# Patient Record
Sex: Male | Born: 1960 | Race: White | Hispanic: No | Marital: Married | Smoking: Light tobacco smoker
Health system: Southern US, Community
[De-identification: ages and names within clinical notes are randomized; demographics above are authoritative.]

## PROBLEM LIST (undated history)

## (undated) DIAGNOSIS — I499 Cardiac arrhythmia, unspecified: Secondary | ICD-10-CM

## (undated) DIAGNOSIS — R002 Palpitations: Secondary | ICD-10-CM

## (undated) DIAGNOSIS — E559 Vitamin D deficiency, unspecified: Secondary | ICD-10-CM

## (undated) HISTORY — DX: Vitamin D deficiency, unspecified: E55.9

## (undated) HISTORY — DX: Palpitations: R00.2

## (undated) HISTORY — DX: Cardiac arrhythmia, unspecified: I49.9

---

## 2006-10-09 ENCOUNTER — Encounter: Admission: RE | Admit: 2006-10-09 | Discharge: 2007-01-07 | Payer: Self-pay | Admitting: Family Medicine

## 2013-03-23 ENCOUNTER — Ambulatory Visit: Payer: Self-pay | Admitting: Family Medicine

## 2013-07-21 ENCOUNTER — Ambulatory Visit (INDEPENDENT_AMBULATORY_CARE_PROVIDER_SITE_OTHER): Payer: BC Managed Care – PPO | Admitting: Family Medicine

## 2013-07-21 ENCOUNTER — Encounter: Payer: Self-pay | Admitting: Family Medicine

## 2013-07-21 VITALS — BP 115/64 | HR 65 | Temp 97.2°F | Ht 70.0 in | Wt 170.4 lb

## 2013-07-21 DIAGNOSIS — R079 Chest pain, unspecified: Secondary | ICD-10-CM

## 2013-07-21 NOTE — Progress Notes (Signed)
  Subjective:    Patient ID: KOAH CHISENHALL, male    DOB: 08/25/1961, 52 y.o.   MRN: [id number]  HPI This 52 y.o. male presents for evaluation of elevated blood pressure.  He was having some Chest pressure this am and he had some anxiety.  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   Review of Systems C/o chest pain   No SOB, HA, dizziness, vision change, N/V, diarrhea, constipation, dysuria, urinary urgency or frequency, myalgias, arthralgias or rash.  Objective:   Physical Exam Vital signs noted  Well developed well nourished male.  HEENT - Head atraumatic Normocephalic                Eyes - PERRLA, Conjuctiva - clear Sclera- Clear EOMI                Ears - EAC's Wnl TM's Wnl Gross Hearing WNL                Nose - Nares patent                 Throat - oropharanx wnl Respiratory - Lungs CTA bilateral Cardiac - RRR S1 and S2 without murmur GI - Abdomen soft Nontender and bowel sounds active x 4 Extremities - No edema. Neuro - Grossly intact.   EKG - NSR without acute ST-T changes.    Assessment & Plan:  Chest pain - Plan: EKG 12-Lead, Ambulatory referral to Cardiology He is not currently having any chest pain and think that there could  Be possibly anxiety component but with his strong family hx would Be good idea to get stress test and work up.  Start ASA 325mg  po qd.

## 2013-07-21 NOTE — Patient Instructions (Signed)
Chest Pain (Nonspecific) °It is often hard to give a specific diagnosis for the cause of chest pain. There is always a chance that your pain could be related to something serious, such as a heart attack or a blood clot in the lungs. You need to follow up with your caregiver for further evaluation. °CAUSES  °· Heartburn. °· Pneumonia or bronchitis. °· Anxiety or stress. °· Inflammation around your heart (pericarditis) or lung (pleuritis or pleurisy). °· A blood clot in the lung. °· A collapsed lung (pneumothorax). It can develop suddenly on its own (spontaneous pneumothorax) or from injury (trauma) to the chest. °· Shingles infection (herpes zoster virus). °The chest wall is composed of bones, muscles, and cartilage. Any of these can be the source of the pain. °· The bones can be bruised by injury. °· The muscles or cartilage can be strained by coughing or overwork. °· The cartilage can be affected by inflammation and become sore (costochondritis). °DIAGNOSIS  °Lab tests or other studies, such as X-rays, electrocardiography, stress testing, or cardiac imaging, may be needed to find the cause of your pain.  °TREATMENT  °· Treatment depends on what may be causing your chest pain. Treatment may include: °· Acid blockers for heartburn. °· Anti-inflammatory medicine. °· Pain medicine for inflammatory conditions. °· Antibiotics if an infection is present. °· You may be advised to change lifestyle habits. This includes stopping smoking and avoiding alcohol, caffeine, and chocolate. °· You may be advised to keep your head raised (elevated) when sleeping. This reduces the chance of acid going backward from your stomach into your esophagus. °· Most of the time, nonspecific chest pain will improve within 2 to 3 days with rest and mild pain medicine. °HOME CARE INSTRUCTIONS  °· If antibiotics were prescribed, take your antibiotics as directed. Finish them even if you start to feel better. °· For the next few days, avoid physical  activities that bring on chest pain. Continue physical activities as directed. °· Do not smoke. °· Avoid drinking alcohol. °· Only take over-the-counter or prescription medicine for pain, discomfort, or fever as directed by your caregiver. °· Follow your caregiver's suggestions for further testing if your chest pain does not go away. °· Keep any follow-up appointments you made. If you do not go to an appointment, you could develop lasting (chronic) problems with pain. If there is any problem keeping an appointment, you must call to reschedule. °SEEK MEDICAL CARE IF:  °· You think you are having problems from the medicine you are taking. Read your medicine instructions carefully. °· Your chest pain does not go away, even after treatment. °· You develop a rash with blisters on your chest. °SEEK IMMEDIATE MEDICAL CARE IF:  °· You have increased chest pain or pain that spreads to your arm, neck, jaw, back, or abdomen. °· You develop shortness of breath, an increasing cough, or you are coughing up blood. °· You have severe back or abdominal pain, feel nauseous, or vomit. °· You develop severe weakness, fainting, or chills. °· You have a fever. °THIS IS AN EMERGENCY. Do not wait to see if the pain will go away. Get medical help at once. Call your local emergency services (911 in [place] Do not drive yourself to the hospital. °MAKE SURE YOU:  °· Understand these instructions. °· Will watch your condition. °· Will get help right away if you are not doing well or get worse. °Document Released: 08/08/2005 Document Revised: 01/21/2012 Document Reviewed: 06/03/2008 °Exit[employer]® Patient Information ©2014 [employer]   [employer]. ° °

## 2013-08-06 ENCOUNTER — Ambulatory Visit (INDEPENDENT_AMBULATORY_CARE_PROVIDER_SITE_OTHER): Payer: BC Managed Care – PPO | Admitting: Family Medicine

## 2013-08-06 ENCOUNTER — Other Ambulatory Visit: Payer: Self-pay | Admitting: *Deleted

## 2013-08-06 VITALS — BP 123/84 | HR 71 | Ht 70.0 in | Wt 170.0 lb

## 2013-08-06 DIAGNOSIS — R002 Palpitations: Secondary | ICD-10-CM

## 2013-08-06 DIAGNOSIS — R0789 Other chest pain: Secondary | ICD-10-CM

## 2013-08-06 DIAGNOSIS — Z8679 Personal history of other diseases of the circulatory system: Secondary | ICD-10-CM

## 2013-08-06 DIAGNOSIS — Z860100 Personal history of colon polyps, unspecified: Secondary | ICD-10-CM | POA: Insufficient documentation

## 2013-08-06 DIAGNOSIS — K3189 Other diseases of stomach and duodenum: Secondary | ICD-10-CM

## 2013-08-06 DIAGNOSIS — K30 Functional dyspepsia: Secondary | ICD-10-CM

## 2013-08-06 DIAGNOSIS — E559 Vitamin D deficiency, unspecified: Secondary | ICD-10-CM | POA: Insufficient documentation

## 2013-08-06 DIAGNOSIS — E785 Hyperlipidemia, unspecified: Secondary | ICD-10-CM | POA: Insufficient documentation

## 2013-08-06 NOTE — Progress Notes (Signed)
  Subjective:    Patient ID: Robert Thomas, male    DOB: Apr 02, 1961, 52 y.o.   MRN: [id number]  HPI Patient comes to clinic today because of continued heaviness and fullness in his chest. He does drink caffeine and this may be somewhat associated with his caffeine intake. He does smoke and has been smoking since he was about 52 years of age, about a pack a day. He has a positive family history for heart disease and that his dad had a stroke in his 90s and died of a heart attack when he was 6. This patient has a history of hyperlipidemia. He does say that he can do work outside and not had any chest discomfort. At other times not necessarily activity related he can have chest pressure tightness or palpitations. This seems to be associated with caffeine intake also. He does have some indigestion at times.   Review of Systems  Constitutional: Positive for diaphoresis and fatigue.  Cardiovascular: Positive for chest pain and palpitations.       Objective:   Physical Exam  Nursing note and vitals reviewed. Constitutional: He is oriented to person, place, and time. He appears well-developed and well-nourished. No distress.  HENT:  Head: Normocephalic.  Eyes: Right eye exhibits no discharge. Left eye exhibits no discharge. No scleral icterus.  Neck: Normal range of motion. Neck supple. No thyromegaly present.  Cardiovascular: Normal rate, regular rhythm, normal heart sounds and intact distal pulses.  Exam reveals no gallop and no friction rub.   No murmur heard. At 72 per minute  Pulmonary/Chest: Effort normal and breath sounds normal. He has no wheezes. He has no rales.  Abdominal: Soft. Bowel sounds are normal. He exhibits no mass. There is no tenderness. There is no rebound and no guarding.  Musculoskeletal: Normal range of motion. He exhibits no edema and no tenderness.  Neurological: He is alert and oriented to person, place, and time.  Skin: Skin is warm and dry. No rash noted. He is not  diaphoretic.  Psychiatric: He has a normal mood and affect. His behavior is normal. Judgment and thought content normal.  Quietly anxious about his condition   . EKG today within normal limits      Assessment & Plan:   1. Palpitations   2. History of cardiac arrhythmia   3. Vitamin D deficiency   4. Hyperlipidemia   5. Personal history of colonic polyps   6. Chest pressure   7. Acid indigestion    We will place Robert Thomas of Hearts monitor on patient today  Patient Instructions  Keep appointment with cardiologist as planned Return to clinic for lab work fasting Completely avoid caffeine Try to make a special effort to completely stop smoking Take Zantac 150 over-the-counter twice daily at least until you see the cardiologist We will do 18 of hearts monitor on use to help him when he evaluates you Do not forget to get your flu shot Continue aggressive therapeutic lifestyle changes which include diet and exercise   Nyra Capes MD  .

## 2013-08-06 NOTE — Addendum Note (Signed)
Addended by: Ardine Eng A on: 08/06/2013 03:23 PM   Modules accepted: Orders

## 2013-08-06 NOTE — Addendum Note (Signed)
Addended by: Bernita Buffy on: 08/06/2013 01:23 PM   Modules accepted: Orders

## 2013-08-06 NOTE — Progress Notes (Signed)
KOH event monitor hook up complete on patient. Pt voices understanding of use and baseline transmission complete. DX palpitations and chest pain

## 2013-08-06 NOTE — Patient Instructions (Signed)
Keep appointment with cardiologist as planned Return to clinic for lab work fasting Completely avoid caffeine Try to make a special effort to completely stop smoking Take Zantac 150 over-the-counter twice daily at least until you see the cardiologist We will do 18 of hearts monitor on use to help him when he evaluates you Do not forget to get your flu shot Continue aggressive therapeutic lifestyle changes which include diet and exercise

## 2013-08-07 ENCOUNTER — Other Ambulatory Visit (INDEPENDENT_AMBULATORY_CARE_PROVIDER_SITE_OTHER): Payer: BC Managed Care – PPO

## 2013-08-07 DIAGNOSIS — R002 Palpitations: Secondary | ICD-10-CM

## 2013-08-07 DIAGNOSIS — E559 Vitamin D deficiency, unspecified: Secondary | ICD-10-CM

## 2013-08-07 DIAGNOSIS — E785 Hyperlipidemia, unspecified: Secondary | ICD-10-CM

## 2013-08-07 NOTE — Progress Notes (Unsigned)
Pt came in for labs only 

## 2013-08-09 LAB — BMP8+EGFR
BUN: 11 mg/dL (ref 6–24)
CO2: 27 mmol/L (ref 18–29)
Calcium: 9 mg/dL (ref 8.7–10.2)
Chloride: 103 mmol/L (ref 97–108)
Glucose: 102 mg/dL — ABNORMAL HIGH (ref 65–99)
Potassium: 4.3 mmol/L (ref 3.5–5.2)

## 2013-08-09 LAB — NMR, LIPOPROFILE
Cholesterol: 143 mg/dL (ref <200)
LDL Particle Number: 1483 nmol/L — ABNORMAL HIGH (ref <1000)
LDL Size: 20.4 nm — ABNORMAL LOW (ref >20.5)
LP-IR Score: 46 — ABNORMAL HIGH (ref <=45)

## 2013-08-09 LAB — HEPATIC FUNCTION PANEL
ALT: 9 IU/L (ref 0–44)
Albumin: 4.4 g/dL (ref 3.5–5.5)
Total Bilirubin: 0.4 mg/dL (ref 0.0–1.2)
Total Protein: 6.3 g/dL (ref 6.0–8.5)

## 2013-08-10 ENCOUNTER — Telehealth: Payer: Self-pay | Admitting: *Deleted

## 2013-08-10 NOTE — Telephone Encounter (Signed)
Left message on patient's cell phone that transmissions so far are normal.

## 2013-08-20 ENCOUNTER — Ambulatory Visit (INDEPENDENT_AMBULATORY_CARE_PROVIDER_SITE_OTHER): Payer: BC Managed Care – PPO | Admitting: Cardiovascular Disease

## 2013-08-20 ENCOUNTER — Encounter: Payer: Self-pay | Admitting: Cardiovascular Disease

## 2013-08-20 VITALS — BP 125/85 | HR 70 | Ht 70.0 in | Wt 169.0 lb

## 2013-08-20 DIAGNOSIS — R079 Chest pain, unspecified: Secondary | ICD-10-CM | POA: Insufficient documentation

## 2013-08-20 DIAGNOSIS — R002 Palpitations: Secondary | ICD-10-CM

## 2013-08-20 DIAGNOSIS — IMO0001 Reserved for inherently not codable concepts without codable children: Secondary | ICD-10-CM

## 2013-08-20 DIAGNOSIS — F17200 Nicotine dependence, unspecified, uncomplicated: Secondary | ICD-10-CM

## 2013-08-20 NOTE — Assessment & Plan Note (Signed)
Counseled for less than 10 minutes  Connection to his congestion and sinus issues discussed.  Quit while in [employer]  Down to 1/2 ppd Thinks he can do it cold [place]

## 2013-08-20 NOTE — Patient Instructions (Addendum)

## 2013-08-20 NOTE — Assessment & Plan Note (Signed)
Cholesterol is at goal.  Continue current dose of statin and diet Rx.  No myalgias or side effects.  F/U  LFT's in 6 months. No results found for this basename: [facility]   Labs with Dr Buel Ream office

## 2013-08-20 NOTE — Assessment & Plan Note (Signed)
Benign Did review the only 3 transmissions from his monitor and they relfected NSR with no arrhythmia

## 2013-08-20 NOTE — Progress Notes (Signed)
Patient ID: Robert Thomas, male   DOB: 10/15/1961, 52 y.o.   MRN: [id number] 52 yo referred by Dr Christell Constant for palpitations and chest pain.  Describes a heaviness/fullness in chest . He does drink caffeine and this may be somewhat associated with his caffeine intake. He does smoke and has been smoking since he was about 52 years of age, about a pack a day. He has a positive family history for heart disease and that his dad had a stroke in his 88s and died of a heart attack when he was 47. This patient has a history of hyperlipidemia. He does say that he can do work outside and not had any chest discomfort. At other times not necessarily activity related he can have chest pressure tightness or palpitations. This seems to be associated with caffeine intake also. He does have some indigestion at times.  Also seen in ER 9/9 with chest pain Atypical ? Anxiety  D/c for outpatient f/u   Indicates similar symptoms in 1998 and thinks he passed a stress test  ROS: Denies fever, malais, weight loss, blurry vision, decreased visual acuity, cough, sputum, SOB, hemoptysis, pleuritic pain, palpitaitons, heartburn, abdominal pain, melena, lower extremity edema, claudication, or rash.  All other systems reviewed and negative   General: Affect appropriate Healthy:  appears stated age HEENT: normal Neck supple with no adenopathy JVP normal no bruits no thyromegaly Lungs clear with no wheezing and good diaphragmatic motion Heart:  S1/S2 no murmur,rub, gallop or click PMI normal Abdomen: benighn, BS positve, no tenderness, no AAA no bruit.  No HSM or HJR Distal pulses intact with no bruits No edema Neuro non-focal Skin warm and dry No muscular weakness  Medications Current Outpatient Prescriptions  Medication Sig Dispense Refill  . aspirin 81 MG tablet Take 81 mg by mouth daily.       No current facility-administered medications for this visit.    Allergies Review of patient's allergies indicates no known  allergies.  Family History: No family history on file.  Social History: History   Social History  . Marital Status: Married    Spouse Name: N/A    Number of Children: N/A  . Years of Education: N/A   Occupational History  . Not on file.   Social History Main Topics  . Smoking status: Light Tobacco Smoker -- 1.00 packs/day    Types: Cigarettes    Start date: 07/21/1976  . Smokeless tobacco: Not on file     Comment: 'almost done with smoking'  . Alcohol Use: No  . Drug Use: No  . Sexual Activity: Not on file   Other Topics Concern  . Not on file   Social History Narrative  . No narrative on file    Electrocardiogram:  9/25  SR rate 95 normal ECG   Assessment and Plan

## 2013-08-20 NOTE — Assessment & Plan Note (Signed)
Typical and atypical features  Normal ECG  F/U ETT

## 2013-09-24 ENCOUNTER — Encounter: Payer: BC Managed Care – PPO | Admitting: Physician Assistant

## 2015-05-30 ENCOUNTER — Ambulatory Visit (INDEPENDENT_AMBULATORY_CARE_PROVIDER_SITE_OTHER): Payer: 59 | Admitting: Family Medicine

## 2015-05-30 ENCOUNTER — Encounter (INDEPENDENT_AMBULATORY_CARE_PROVIDER_SITE_OTHER): Payer: Self-pay

## 2015-05-30 ENCOUNTER — Encounter: Payer: Self-pay | Admitting: Family Medicine

## 2015-05-30 VITALS — BP 130/86 | HR 79 | Temp 97.2°F | Ht 69.0 in | Wt 173.0 lb

## 2015-05-30 DIAGNOSIS — Z111 Encounter for screening for respiratory tuberculosis: Secondary | ICD-10-CM | POA: Diagnosis not present

## 2015-05-30 DIAGNOSIS — Z0000 Encounter for general adult medical examination without abnormal findings: Secondary | ICD-10-CM

## 2015-05-30 LAB — POCT URINALYSIS DIPSTICK
Bilirubin, UA: NEGATIVE
Glucose, UA: NEGATIVE
Ketones, UA: NEGATIVE
Leukocytes, UA: NEGATIVE
Nitrite, UA: NEGATIVE
PH UA: 6
Protein, UA: NEGATIVE
RBC UA: NEGATIVE
SPEC GRAV UA: 1.015
Urobilinogen, UA: NEGATIVE

## 2015-05-30 LAB — POCT UA - MICROSCOPIC ONLY
Bacteria, U Microscopic: NEGATIVE
CRYSTALS, UR, HPF, POC: NEGATIVE
Casts, Ur, LPF, POC: NEGATIVE
Mucus, UA: NEGATIVE
RBC, URINE, MICROSCOPIC: NEGATIVE
WBC, UR, HPF, POC: NEGATIVE
YEAST UA: NEGATIVE

## 2015-05-30 NOTE — Progress Notes (Signed)
   Subjective:    Patient ID: Robert Thomas, male    DOB: 08/09/1961, 54 y.o.   MRN: [id number]  HPI Patient here today for pre- [place] [employer] [employer] He is currently not taking any RX medications and has no physical complaints today. Bulk is really healthy. I reviewed his past medical social family history as well as review of systems and there is nothing to point to her suggest any underlying problem.     Patient Active Problem List   Diagnosis Date Noted  . Chest pain 08/20/2013  . Smoking 08/20/2013  . Palpitations 08/06/2013  . History of cardiac arrhythmia 08/06/2013  . Vitamin D deficiency 08/06/2013  . Hyperlipidemia 08/06/2013  . Personal history of colonic polyps 08/06/2013   Outpatient Encounter Prescriptions as of 05/30/2015  Medication Sig  . aspirin 81 MG tablet Take 81 mg by mouth daily.   No facility-administered encounter medications on file as of 05/30/2015.      Review of Systems  Constitutional: Negative.   HENT: Negative.   Eyes: Negative.   Respiratory: Negative.   Cardiovascular: Negative.   Gastrointestinal: Negative.   Endocrine: Negative.   Genitourinary: Negative.   Musculoskeletal: Negative.   Skin: Negative.   Allergic/Immunologic: Negative.   Neurological: Negative.   Hematological: Negative.   Psychiatric/Behavioral: Negative.        Objective:   Physical Exam  Constitutional: He is oriented to person, place, and time. He appears well-developed and well-nourished.  HENT:  Head: Normocephalic.  Right Ear: External ear normal.  Left Ear: External ear normal.  Nose: Nose normal.  Mouth/Throat: Oropharynx is clear and moist.  Eyes: Conjunctivae and EOM are normal. Pupils are equal, round, and reactive to light.  Neck: Normal range of motion. Neck supple.  Cardiovascular: Normal rate, regular rhythm, normal heart sounds and intact distal pulses.   Pulmonary/Chest: Effort normal and breath sounds normal.    Abdominal: Soft. Bowel sounds are normal.  Musculoskeletal: Normal range of motion.  Neurological: He is alert and oriented to person, place, and time.  Skin: Skin is warm and dry.  Psychiatric: He has a normal mood and affect. His behavior is normal. Judgment and thought content normal.   BP 130/86 mmHg  Pulse 79  Temp(Src) 97.2 F (36.2 C) (Oral)  Ht 5\' 9"  (1.753 m)  Wt 173 lb (78.472 kg)  BMI 25.54 kg/m2        Assessment & Plan:  1. Annual physical exam Exam is normal. We will do urine PPD and drug screen as per dictate of this physical form - POCT UA - Microscopic Only - POCT urinalysis dipstick - PPD - Drug Screen, Urine   Frederica KusterStephen M Dandra Velardi MD

## 2015-06-01 LAB — TB SKIN TEST
Induration: 0 mm
TB Skin Test: NEGATIVE

## 2015-10-17 ENCOUNTER — Ambulatory Visit (INDEPENDENT_AMBULATORY_CARE_PROVIDER_SITE_OTHER): Payer: 59 | Admitting: Family Medicine

## 2015-10-17 ENCOUNTER — Encounter: Payer: Self-pay | Admitting: Family Medicine

## 2015-10-17 VITALS — BP 128/87 | HR 81 | Temp 97.0°F | Ht 69.0 in | Wt 172.4 lb

## 2015-10-17 DIAGNOSIS — L03811 Cellulitis of head [any part, except face]: Secondary | ICD-10-CM

## 2015-10-17 MED ORDER — SULFAMETHOXAZOLE-TRIMETHOPRIM 800-160 MG PO TABS: 1.0000 | ORAL_TABLET | Freq: Two times a day (BID) | ORAL | Status: DC

## 2015-10-17 NOTE — Progress Notes (Signed)
BP 128/87 mmHg  Pulse 81  Temp(Src) 97 F (36.1 C) (Oral)  Ht  (1.753 m)  Wt 172 lb 6.4 oz (78.2 kg)  BMI 25.45 kg/m2   Subjective:    Patient ID: Robert Thomas, male    DOB: 24-Apr-1961, 54 y.o.   MRN: [id number]  HPI: Robert Thomas is a 54 y.o. male presenting on 10/17/2015 for painful lump on right side of head and Sinusitis   HPI Skin infection Skin infection on right lateral scalp. He had a sinus infection for a week and that started getting better but then he developed this swelling on the right side of his head. There is pain associated with it that radiates down behind his ear on that side as well. He feels the lump just above his ear just behind his temple. He denies any fevers or chills. He says his only been there a day. He has never had skin infections before.  Relevant past medical, surgical, family and social history reviewed and updated as indicated. Interim medical history since our last visit reviewed. Allergies and medications reviewed and updated.  Review of Systems  Constitutional: Negative for fever.  HENT: Negative for ear discharge and ear pain.   Eyes: Negative for discharge and visual disturbance.  Respiratory: Negative for shortness of breath and wheezing.   Cardiovascular: Negative for chest pain and leg swelling.  Gastrointestinal: Negative for abdominal pain, diarrhea and constipation.  Genitourinary: Negative for difficulty urinating.  Musculoskeletal: Negative for back pain and gait problem.  Skin: Negative for rash.  Neurological: Negative for syncope, light-headedness and headaches.  All other systems reviewed and are negative.   Per HPI unless specifically indicated above     Medication List       This list is accurate as of: 10/17/15 11:51 AM.  Always use your most recent med list.               sulfamethoxazole-trimethoprim 800-160 MG tablet  Commonly known as:  BACTRIM DS  Take 1 tablet by mouth 2 (two) times daily.           Objective:    BP 128/87 mmHg  Pulse 81  Temp(Src) 97 F (36.1 C) (Oral)  Ht  (1.753 m)  Wt 172 lb 6.4 oz (78.2 kg)  BMI 25.45 kg/m2  Wt Readings from Last 3 Encounters:  10/17/15 172 lb 6.4 oz (78.2 kg)  05/30/15 173 lb (78.472 kg)  08/20/13 169 lb (76.658 kg)    Physical Exam  Constitutional: He is oriented to person, place, and time. He appears well-developed and well-nourished. No distress.  HENT:  Right Ear: External ear normal.  Left Ear: External ear normal.  Nose: Nose normal.  Mouth/Throat: Oropharynx is clear and moist. No oropharyngeal exudate.  Eyes: Conjunctivae and EOM are normal. Right eye exhibits no discharge. No scleral icterus.  Cardiovascular: Normal rate, regular rhythm, normal heart sounds and intact distal pulses.   No murmur heard. Pulmonary/Chest: Effort normal and breath sounds normal. No respiratory distress. He has no wheezes.  Musculoskeletal: Normal range of motion. He exhibits no edema.  Neurological: He is alert and oriented to person, place, and time. Coordination normal.  Skin: Skin is warm and dry. No rash noted. He is not diaphoretic. There is erythema (Half centimeter by 2 cm area of swelling and erythema on his right lateral scalp just above his ear, tender to palpation.).  Psychiatric: He has a normal mood and affect. His behavior is  normal.  Vitals reviewed.   Results for orders placed or performed in visit on 05/30/15  POCT UA - Microscopic Only  Result Value Ref Range   WBC, Ur, HPF, POC neg    RBC, urine, microscopic neg    Bacteria, U Microscopic neg    Mucus, UA neg    Epithelial cells, urine per micros occ    Crystals, Ur, HPF, POC neg    Casts, Ur, LPF, POC neg    Yeast, UA neg   POCT urinalysis dipstick  Result Value Ref Range   Color, UA yellow    Clarity, UA clear    Glucose, UA neg    Bilirubin, UA neg    Ketones, UA neg    Spec Grav, UA 1.015    Blood, UA neg    pH, UA 6.0    Protein, UA neg     Urobilinogen, UA negative    Nitrite, UA neg    Leukocytes, UA Negative Negative  PPD  Result Value Ref Range   TB Skin Test Negative    Induration 0 mm      Assessment & Plan:   Problem List Items Addressed This Visit    None    Visit Diagnoses    Cellulitis of head except face    -  Primary    Right lateral scalp. Treat with Bactrim if not improved consider temporal arteritis as a possible differential    Relevant Medications    sulfamethoxazole-trimethoprim (BACTRIM DS) 800-160 MG tablet        Follow up plan: Return if symptoms worsen or fail to improve.  Counseling provided for all of the vaccine components No orders of the defined types were placed in this encounter.    Arville CareJoshua Makesha Belitz, MD [facility] 10/17/2015, 11:51 AM

## 2015-10-19 ENCOUNTER — Other Ambulatory Visit (INDEPENDENT_AMBULATORY_CARE_PROVIDER_SITE_OTHER): Payer: 59

## 2015-10-19 DIAGNOSIS — R51 Headache: Secondary | ICD-10-CM

## 2015-10-19 DIAGNOSIS — R519 Headache, unspecified: Secondary | ICD-10-CM

## 2015-10-19 DIAGNOSIS — R21 Rash and other nonspecific skin eruption: Secondary | ICD-10-CM

## 2015-10-19 MED ORDER — VALACYCLOVIR HCL 1 G PO TABS: 1000.0000 mg | ORAL_TABLET | Freq: Two times a day (BID) | ORAL | Status: DC

## 2015-10-20 LAB — C-REACTIVE PROTEIN: CRP: 0.9 mg/L (ref 0.0–4.9)

## 2015-10-20 LAB — SEDIMENTATION RATE: Sed Rate: 2 mm/hr (ref 0–30)

## 2015-11-16 ENCOUNTER — Ambulatory Visit (INDEPENDENT_AMBULATORY_CARE_PROVIDER_SITE_OTHER): Payer: Self-pay | Admitting: Nurse Practitioner

## 2015-11-16 ENCOUNTER — Encounter: Payer: Self-pay | Admitting: Nurse Practitioner

## 2015-11-16 DIAGNOSIS — Z024 Encounter for examination for driving license: Secondary | ICD-10-CM

## 2015-11-16 LAB — POCT URINALYSIS DIPSTICK
Bilirubin, UA: NEGATIVE
Blood, UA: NEGATIVE
Glucose, UA: NEGATIVE
Ketones, UA: NEGATIVE
Leukocytes, UA: NEGATIVE
NITRITE UA: NEGATIVE
PH UA: 5
Protein, UA: NEGATIVE
Spec Grav, UA: <=1.005
UROBILINOGEN UA: NEGATIVE

## 2015-11-16 NOTE — Progress Notes (Signed)
Patient ID: Robert Thomas, male   DOB: 07/29/1961, 55 y.o.   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

## 2016-07-14 ENCOUNTER — Ambulatory Visit (INDEPENDENT_AMBULATORY_CARE_PROVIDER_SITE_OTHER): Payer: 59 | Admitting: Family Medicine

## 2016-07-14 VITALS — BP 116/66 | HR 72 | Temp 97.5°F | Ht 69.0 in | Wt 171.0 lb

## 2016-07-14 DIAGNOSIS — F17200 Nicotine dependence, unspecified, uncomplicated: Secondary | ICD-10-CM

## 2016-07-14 DIAGNOSIS — Z720 Tobacco use: Secondary | ICD-10-CM | POA: Diagnosis not present

## 2016-07-14 DIAGNOSIS — H8110 Benign paroxysmal vertigo, unspecified ear: Secondary | ICD-10-CM | POA: Diagnosis not present

## 2016-07-14 MED ORDER — VARENICLINE TARTRATE 0.5 MG X 11 & 1 MG X 42 PO MISC: ORAL | 0 refills | Status: DC

## 2016-07-14 MED ORDER — VARENICLINE TARTRATE 1 MG PO TABS: 1.0000 mg | ORAL_TABLET | Freq: Two times a day (BID) | ORAL | 1 refills | Status: DC

## 2016-07-14 NOTE — Patient Instructions (Signed)
Great to meet you!  Come back in 3-44 week sto follow up for stoppong smoking  Start chantix 1 week before your quit date.  Consider calling 1800 Quit now for counseling.    Try the Epleys maneuver for vertigo (search epley's maneuver on youtube.com, the first video can be very helpful, its by [facility] )   Try dramamine (look for meclizine as the active ingredient) as well

## 2016-07-14 NOTE — Progress Notes (Signed)
   HPI  Patient presents today here with dizziness.  Patient explains that he woke up this morning with room spinning sensation that was very intense for the first early minutes or so. After sitting for a while it got better, he's gotten gradually better since that time, at this time he does not have any room spinning sensation but does still have sensation of being "off balance"  Any weakness, numbness, tingling, or other concerns.  He denies any fever, ear pain, chills, sweats, or shortness of breath.  He's been smoking for quite some time, he previously tolerated Chantix well but restarted smoking. He would like to restart Chantix.    PMH: Smoking status noted ROS: Per HPI  Objective: BP 116/66 (BP Location: Left Arm, Patient Position: Sitting, Cuff Size: Normal)   Pulse 72   Temp 97.5 F (36.4 C) (Oral)   Ht 5\' 9"  (1.753 m)   Wt 171 lb (77.6 kg)   BMI 25.25 kg/m  Gen: NAD, alert, cooperative with exam HEENT: NCAT, EOMI, PERRL, TMs normal bilaterally CV: RRR, good S1/S2, no murmur Resp: CTABL, no wheezes, non-labored Ext: No edema, warm Neuro: Alert and oriented, strength 5/5 and sensation intact in all 4 extremities, cranial nerve II through XII intact, normal gait  Assessment and plan:  # Benign positional vertigo Discussed Epley's maneuvers, given handout Symptoms are resolving currently. Clinical any concerns, also discussed over-the-counter meclizine  # Smoking He would like to quit Given prescription for Chantix, follow-up in 3-4 weeks    Meds ordered this encounter  Medications  . varenicline (CHANTIX STARTING MONTH PAK) 0.5 MG X 11 & 1 MG X 42 tablet    Sig: Take one 0.5 mg tablet by mouth once daily for 3 days, then increase to one 0.5 mg tablet twice daily for 4 days, then increase to one 1 mg tablet twice daily.    Dispense:  53 tablet    Refill:  0  . varenicline (CHANTIX CONTINUING MONTH PAK) 1 MG tablet    Sig: Take 1 tablet (1 mg total) by mouth  2 (two) times daily.    Dispense:  60 tablet    Refill:  1    Murtis SinkSam Joneric Streight, MD [facility] 07/14/2016, 11:37 AM

## 2016-09-12 ENCOUNTER — Other Ambulatory Visit: Payer: Self-pay

## 2016-09-12 MED ORDER — VARENICLINE TARTRATE 1 MG PO TABS
1.0000 mg | ORAL_TABLET | Freq: Two times a day (BID) | ORAL | 0 refills | Status: DC
Start: 2016-09-12 — End: 2017-01-28

## 2016-12-18 ENCOUNTER — Other Ambulatory Visit: Payer: Self-pay | Admitting: *Deleted

## 2016-12-18 MED ORDER — OSELTAMIVIR PHOSPHATE 75 MG PO CAPS: 75.0000 mg | ORAL_CAPSULE | Freq: Every day | ORAL | 0 refills | Status: AC

## 2016-12-18 NOTE — Progress Notes (Unsigned)
Prescription sent to pharmacy  Influenza exposure household contact. 

## 2017-01-28 ENCOUNTER — Encounter: Payer: Self-pay | Admitting: *Deleted

## 2017-01-28 ENCOUNTER — Encounter: Payer: Self-pay | Admitting: Family Medicine

## 2017-01-28 ENCOUNTER — Ambulatory Visit (INDEPENDENT_AMBULATORY_CARE_PROVIDER_SITE_OTHER): Payer: 59 | Admitting: Family Medicine

## 2017-01-28 VITALS — BP 128/87 | HR 97 | Temp 97.6°F | Ht 69.0 in | Wt 173.0 lb

## 2017-01-28 DIAGNOSIS — J0100 Acute maxillary sinusitis, unspecified: Secondary | ICD-10-CM

## 2017-01-28 MED ORDER — AMOXICILLIN-POT CLAVULANATE 875-125 MG PO TABS: 1.0000 | ORAL_TABLET | Freq: Two times a day (BID) | ORAL | 0 refills | Status: DC

## 2017-01-28 NOTE — Progress Notes (Signed)
   Subjective:    Patient ID: Robert Thomas, male    DOB: 10/10/1961, 56 y.o.   MRN: [id number]  HPI Patient here today for sinus pressure and body aches that started on Saturday.Complains of drainage. Cough has been minimal. No history of chest pain. He has not tried any over-the-counter meds.    Patient Active Problem List   Diagnosis Date Noted  . Chest pain 08/20/2013  . Smoking 08/20/2013  . Palpitations 08/06/2013  . History of cardiac arrhythmia 08/06/2013  . Vitamin D deficiency 08/06/2013  . Hyperlipidemia 08/06/2013  . Personal history of colonic polyps 08/06/2013   Outpatient Encounter Prescriptions as of 01/28/2017  Medication Sig  . [DISCONTINUED] varenicline (CHANTIX CONTINUING MONTH PAK) 1 MG tablet Take 1 tablet (1 mg total) by mouth 2 (two) times daily.  . [DISCONTINUED] varenicline (CHANTIX STARTING MONTH PAK) 0.5 MG X 11 & 1 MG X 42 tablet Take one 0.5 mg tablet by mouth once daily for 3 days, then increase to one 0.5 mg tablet twice daily for 4 days, then increase to one 1 mg tablet twice daily.   No facility-administered encounter medications on file as of 01/28/2017.       Review of Systems  Constitutional: Negative.  Fever: unknown / some sweats.  HENT: Positive for postnasal drip and sinus pressure.   Eyes: Negative.   Respiratory: Positive for cough (dry).   Cardiovascular: Negative.   Gastrointestinal: Negative.   Endocrine: Negative.   Genitourinary: Negative.   Musculoskeletal: Positive for myalgias.  Skin: Negative.   Allergic/Immunologic: Negative.   Neurological: Negative.   Hematological: Negative.   Psychiatric/Behavioral: Negative.        Objective:   Physical Exam  Constitutional: He is oriented to person, place, and time. He appears well-developed and well-nourished.  HENT:  Right Ear: External ear normal.  Left Ear: External ear normal.  Mouth/Throat: Oropharynx is clear and moist.  Sinuses are tender to percussion    Cardiovascular: Normal rate and regular rhythm.   Pulmonary/Chest: Effort normal.  Neurological: He is alert and oriented to person, place, and time.  Psychiatric: He has a normal mood and affect. His behavior is normal.   BP 128/87 (BP Location: Right Arm)   Pulse 97   Temp 97.6 F (36.4 C) (Oral)   Ht 5\' 9"  (1.753 m)   Wt 173 lb (78.5 kg)   BMI 25.55 kg/m        Assessment & Plan:  1. Acute non-recurrent maxillary sinusitis Will treat with amoxicillin 875 twice a day for 10 days area also add Mucinex, OTC. Drink extra fluids and spend time and hot shower. Rest as long as he feels tired.  Frederica KusterStephen M Miller MD

## 2017-08-08 ENCOUNTER — Encounter: Payer: Self-pay | Admitting: Family Medicine

## 2017-08-08 ENCOUNTER — Ambulatory Visit (INDEPENDENT_AMBULATORY_CARE_PROVIDER_SITE_OTHER): Payer: 59

## 2017-08-08 ENCOUNTER — Ambulatory Visit (INDEPENDENT_AMBULATORY_CARE_PROVIDER_SITE_OTHER): Payer: 59 | Admitting: Family Medicine

## 2017-08-08 VITALS — BP 133/85 | HR 79 | Temp 98.3°F | Ht 69.0 in | Wt 172.0 lb

## 2017-08-08 DIAGNOSIS — Z23 Encounter for immunization: Secondary | ICD-10-CM | POA: Diagnosis not present

## 2017-08-08 DIAGNOSIS — M25561 Pain in right knee: Secondary | ICD-10-CM

## 2017-08-08 MED ORDER — MELOXICAM 15 MG PO TABS: 15.0000 mg | ORAL_TABLET | Freq: Every day | ORAL | 1 refills | Status: DC

## 2017-08-08 MED ORDER — SILDENAFIL CITRATE 20 MG PO TABS: 20.0000 mg | ORAL_TABLET | Freq: Every day | ORAL | 5 refills | Status: DC | PRN

## 2017-08-08 NOTE — Progress Notes (Signed)
BP 133/85   Pulse 79   Temp 98.3 F (36.8 C) (Oral)   Ht  (1.753 m)   Wt 172 lb (78 kg)   BMI 25.40 kg/m    Subjective:    Patient ID: Robert Thomas, male    DOB: 08-26-1961, 56 y.o.   MRN: [id number]  HPI: Robert Thomas is a 56 y.o. male presenting on 08/08/2017 for 2 months of dull aching right sided knee pain exacerbated by kneeling and activity. He reports that his pain was relieved when he used ibuprofen 2-3 times per day, but he is unable to continue this regimen due to stomach upset and constipation. He additionally reports that his knee feels "hot" inside, but not to the touch, and seems mildly swollen compared to the other one. He has no other complaints today.   HPI   Relevant past medical, surgical, family and social history reviewed and updated as indicated. Interim medical history since our last visit reviewed. Allergies and medications reviewed and updated.  Review of Systems  Constitutional: Negative for chills, fatigue and fever.  HENT: Negative for congestion, rhinorrhea, sinus pain, sinus pressure and sneezing.   Respiratory: Negative for cough, shortness of breath and wheezing.   Cardiovascular: Negative for chest pain, palpitations and leg swelling.  Gastrointestinal: Negative for abdominal pain, nausea and vomiting.  Genitourinary:       Erectile dysfunction   Musculoskeletal: Positive for arthralgias (right knee) and joint swelling. Negative for myalgias.  Skin: Negative for color change, pallor, rash and wound.  Neurological: Negative for dizziness, weakness and light-headedness.  Psychiatric/Behavioral: Negative for agitation, behavioral problems and confusion.    Per HPI unless specifically indicated above        Objective:    BP 133/85   Pulse 79   Temp 98.3 F (36.8 C) (Oral)   Ht  (1.753 m)   Wt 172 lb (78 kg)   BMI 25.40 kg/m   Wt Readings from Last 3 Encounters:  08/08/17 172 lb (78 kg)  01/28/17 173 lb (78.5 kg)    07/14/16 171 lb (77.6 kg)    Physical Exam  Constitutional: He is oriented to person, place, and time. He appears well-developed and well-nourished. No distress.  HENT:  Head: Normocephalic and atraumatic.  Mouth/Throat: Oropharynx is clear and moist.  Eyes: Pupils are equal, round, and reactive to light. Conjunctivae and EOM are normal.  Neck: Normal range of motion. Neck supple. No thyromegaly present.  Cardiovascular: Normal rate, regular rhythm and normal heart sounds.   No murmur heard. Pulmonary/Chest: Effort normal and breath sounds normal. No respiratory distress. He has no wheezes. He has no rales.  Abdominal: Soft. Bowel sounds are normal. There is no tenderness.  Musculoskeletal: Normal range of motion. He exhibits edema (mild swelling right knee) and tenderness (TTP anterior patella right knee). He exhibits no deformity.  Negative R Mcmurray Negative R Lachman Negative R posterior drawer    Neurological: He is alert and oriented to person, place, and time. He has normal reflexes.  Skin: Skin is warm and dry. No rash noted. He is not diaphoretic. No erythema. No pallor.  Psychiatric: He has a normal mood and affect. His behavior is normal. Judgment and thought content normal.  Nursing note and vitals reviewed.    X-ray: DG Knee 1-2 Views Right - shows medial compartmental narrowing on the right side. Will be over-read by radiologist.      Assessment & Plan:   Problem List Items  Addressed This Visit    None    Visit Diagnoses    Acute pain of right knee    -  Primary   Relevant Medications   meloxicam (MOBIC) 15 MG tablet   Other Relevant Orders   DG Knee 1-2 Views Right (Completed)      Robert Thomas is a 56 y.o. male presenting on 08/08/2017 for 2 months of dull aching right sided knee pain exacerbated by kneeling and activity. On exam he has mild TTP over anterior patellar region on the right side with mild edema and no erythema. X-ray shows medial  compartmental narrowing on the right side. This is likely early stage arthritis. I will prescribe meloxicam and encourage him to return if his symptoms do not improve or any redness, fever, or chills develop in the next few weeks.   Follow up plan: Return if symptoms worsen or fail to improve.  Counseling provided for all of the vaccine components Orders Placed This Encounter  Procedures  . DG Knee 1-2 Views Right  . Tdap vaccine greater than or equal to 7yo IM   Patient seen and examined with Havery Moros medical student. Agree with assessment and plan above.   Arville Care, MD [facility] 08/08/2017, 2:42 PM

## 2017-08-14 ENCOUNTER — Other Ambulatory Visit: Payer: Self-pay | Admitting: *Deleted

## 2017-08-14 MED ORDER — ZOSTER VAC RECOMB ADJUVANTED 50 MCG/0.5ML IM SUSR: 0.5000 mL | Freq: Once | INTRAMUSCULAR | 0 refills | Status: AC

## 2017-08-14 NOTE — Telephone Encounter (Signed)
Order for Shingrix sent to [facility]

## 2017-10-01 ENCOUNTER — Other Ambulatory Visit: Payer: Self-pay

## 2017-10-01 DIAGNOSIS — M25561 Pain in right knee: Secondary | ICD-10-CM

## 2017-10-01 MED ORDER — MELOXICAM 15 MG PO TABS: 15.0000 mg | ORAL_TABLET | Freq: Every day | ORAL | 0 refills | Status: DC

## 2018-01-16 ENCOUNTER — Other Ambulatory Visit: Payer: Self-pay | Admitting: Family Medicine

## 2018-01-16 DIAGNOSIS — M25561 Pain in right knee: Secondary | ICD-10-CM

## 2018-03-14 IMAGING — DX DG KNEE 1-2V*R*
2 series · 2 of 2 positions shown · non-contrast
Comparison: None.

CLINICAL DATA: Right knee pain, no known injury, initial encounter

EXAM:
RIGHT KNEE - 1-2 VIEW

[knee ap]
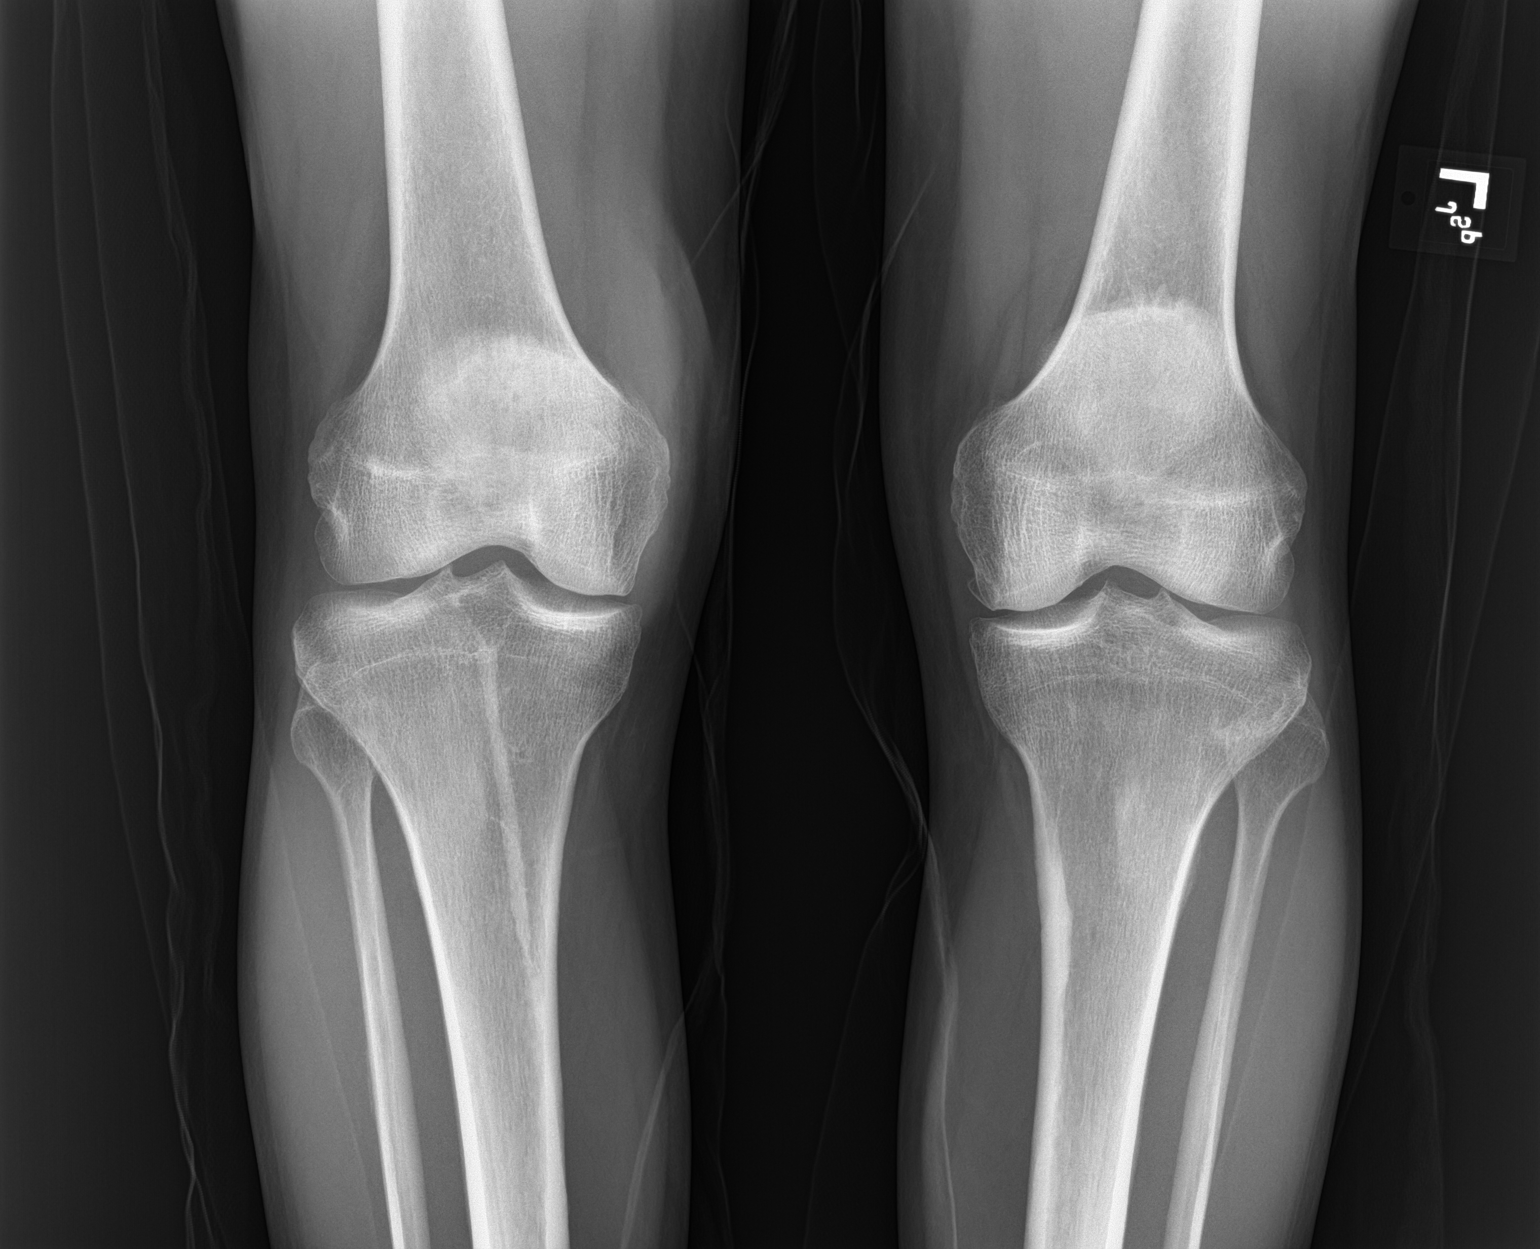

[knee lat]
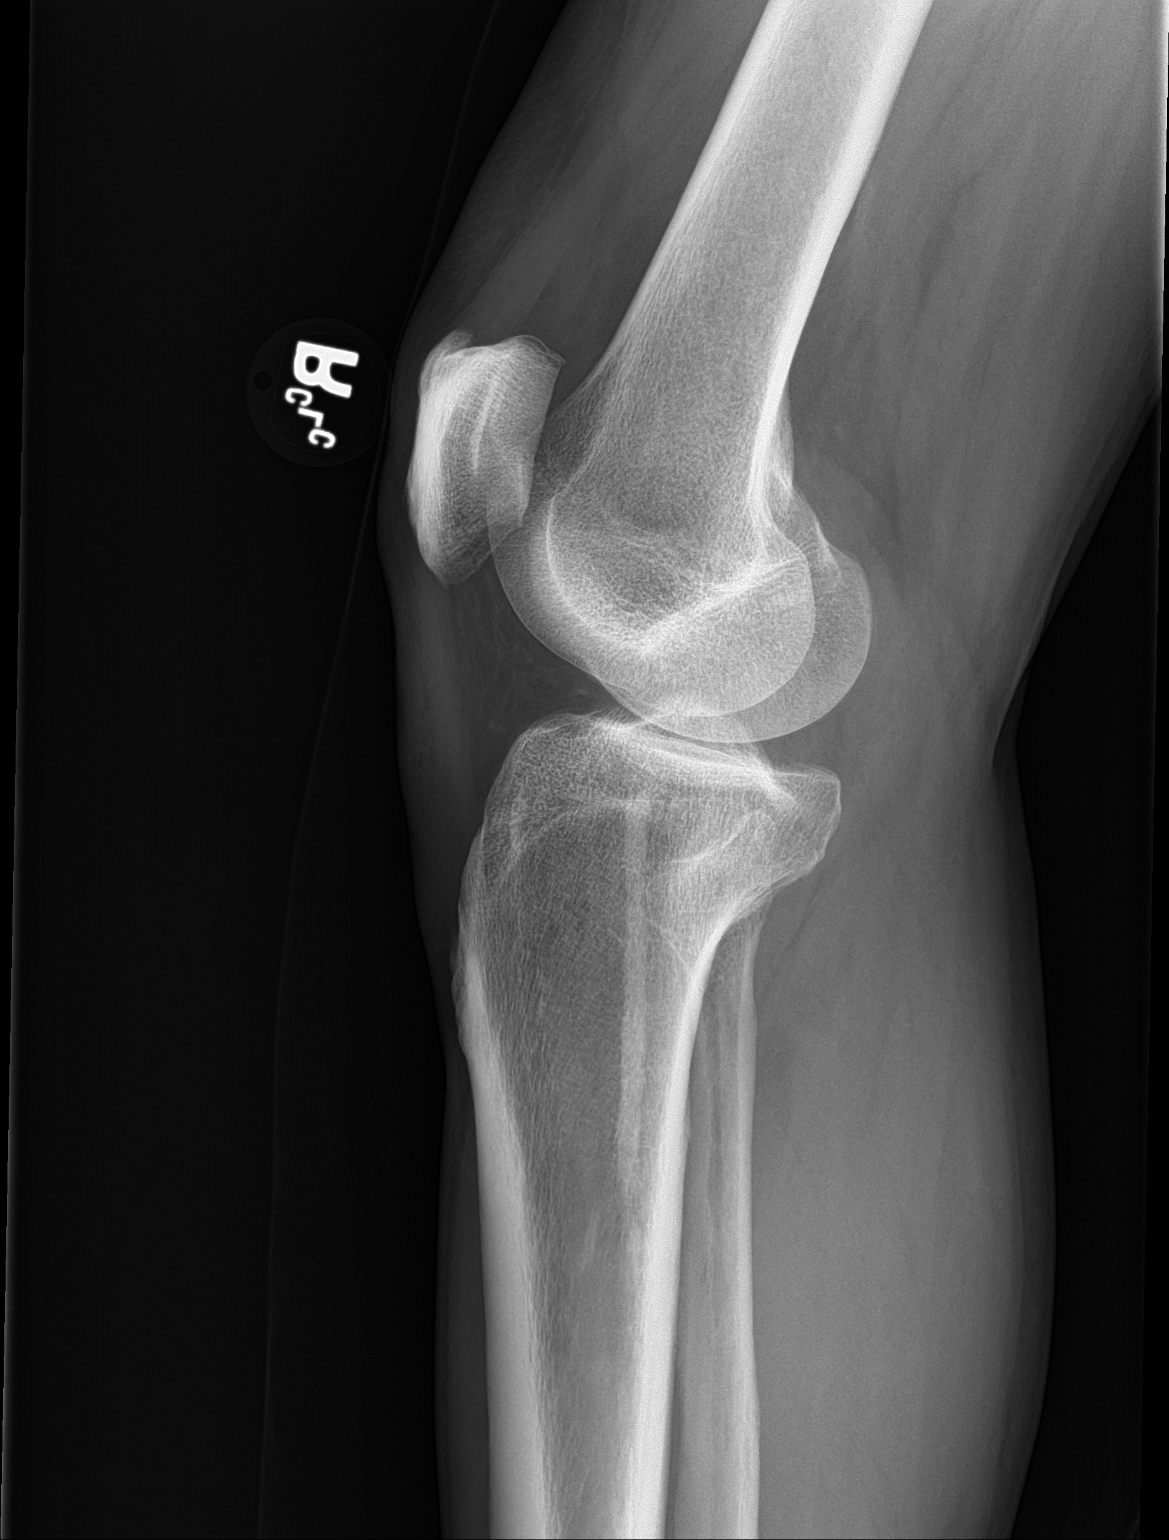

[2 of 2 positions shown; findings below may reference images not displayed]

FINDINGS: Mild medial joint space narrowing is noted. No acute fracture or
dislocation is seen. Linear density is noted in the proximal tibia
likely developmental in nature.
IMPRESSION: Mild degenerative change without acute abnormality.

## 2018-07-11 ENCOUNTER — Ambulatory Visit (INDEPENDENT_AMBULATORY_CARE_PROVIDER_SITE_OTHER): Payer: 59 | Admitting: Family Medicine

## 2018-07-11 ENCOUNTER — Encounter: Payer: Self-pay | Admitting: Family Medicine

## 2018-07-11 VITALS — BP 122/75 | HR 72 | Temp 97.1°F | Ht 69.0 in | Wt 166.0 lb

## 2018-07-11 DIAGNOSIS — B029 Zoster without complications: Secondary | ICD-10-CM

## 2018-07-11 MED ORDER — TRIAMCINOLONE 0.1 % CREAM:EUCERIN CREAM 1:1: TOPICAL_CREAM | CUTANEOUS | 0 refills | Status: DC

## 2018-07-11 MED ORDER — VALACYCLOVIR HCL 1 G PO TABS: 1000.0000 mg | ORAL_TABLET | Freq: Three times a day (TID) | ORAL | 0 refills | Status: DC

## 2018-07-11 MED ORDER — TRIAMCINOLONE ACETONIDE 0.1 % EX CREA: 1.0000 "application " | TOPICAL_CREAM | Freq: Three times a day (TID) | CUTANEOUS | 0 refills | Status: DC

## 2018-07-11 NOTE — Progress Notes (Signed)
Chief Complaint  Patient presents with  . Rash    possible shingles    HPI  Patient presents today for rash on left chest. Onset 3 days ago. Has an itch and some burning. Similar to when he had shingles several years ago.   PMH: Smoking status noted ROS: Per HPI  Objective: BP 122/75 (BP Location: Left Arm)   Pulse 72   Temp (!) 97.1 F (36.2 C) (Oral)   Ht 5\' 9"  (1.753 m)   Wt 166 lb (75.3 kg)   BMI 24.51 kg/m  Gen: NAD, alert, cooperative with exam HEENT: NCAT, EOMI, PERRL CV: RRR, good S1/S2, no murmur Resp: CTABL, no wheezes, non-labored Skin: red papulovesicular eruption at left chest. Does not cross midline.  Ext: No edema, warm Neuro: Alert and oriented, No gross deficits    Assessment and plan:  1. Herpes zoster without complication     Meds ordered this encounter  Medications  . DISCONTD: Triamcinolone Acetonide (TRIAMCINOLONE 0.1 % CREAM : EUCERIN) CREA    Sig: Apply topically to affected area TID    Dispense:  60 each    Refill:  0  . valACYclovir (VALTREX) 1000 MG tablet    Sig: Take 1 tablet (1,000 mg total) by mouth 3 (three) times daily.    Dispense:  21 tablet    Refill:  0  . DISCONTD: Triamcinolone Acetonide (TRIAMCINOLONE 0.1 % CREAM : EUCERIN) CREA    Sig: Apply topically to affected area TID    Dispense:  60 each    Refill:  0  . triamcinolone cream (KENALOG) 0.1 %    Sig: Apply 1 application topically 3 (three) times daily.    Dispense:  60 g    Refill:  0    Follow up as needed.  Mechele ClaudeWarren Sarai January, MD

## 2018-07-16 ENCOUNTER — Encounter: Payer: Self-pay | Admitting: Family Medicine

## 2018-10-08 ENCOUNTER — Telehealth: Payer: Self-pay | Admitting: *Deleted

## 2018-10-08 NOTE — Telephone Encounter (Signed)
Patient received flu shot at [facility] on 08/15/2018.  Documented in Epic.

## 2018-11-25 ENCOUNTER — Other Ambulatory Visit: Payer: Self-pay | Admitting: Family Medicine

## 2018-11-25 DIAGNOSIS — M25561 Pain in right knee: Secondary | ICD-10-CM

## 2018-11-25 NOTE — Telephone Encounter (Signed)
[facility]   PT is needing a refill on meloxicam (MOBIC) 15 MG tablet

## 2018-11-25 NOTE — Telephone Encounter (Signed)
[facility] NTBS for refills

## 2018-11-27 ENCOUNTER — Encounter: Payer: Self-pay | Admitting: Pediatrics

## 2018-11-27 ENCOUNTER — Ambulatory Visit: Payer: 59 | Admitting: Pediatrics

## 2018-11-27 VITALS — BP 119/68 | HR 80 | Temp 97.9°F | Ht 69.0 in | Wt 168.1 lb

## 2018-11-27 DIAGNOSIS — M1712 Unilateral primary osteoarthritis, left knee: Secondary | ICD-10-CM

## 2018-11-27 DIAGNOSIS — Z5181 Encounter for therapeutic drug level monitoring: Secondary | ICD-10-CM

## 2018-11-27 MED ORDER — MELOXICAM 15 MG PO TABS: 15.0000 mg | ORAL_TABLET | Freq: Every day | ORAL | 1 refills | Status: AC

## 2018-11-27 NOTE — Progress Notes (Signed)
  Subjective:   Patient ID: Robert Thomas, male    DOB: 1960-12-05, 58 y.o.   MRN: [id number] CC: Arthritis  HPI: [name] is a 58 y.o. male   Has had knee pain over the last couple years.  Left bothering him more than right.  Works as a [employer].  Is been taking meloxicam a few days a week over the last year with good improvement in symptoms.  Here for refill.  No recent BMP.  Relevant past medical, surgical, family and social history reviewed. Allergies and medications reviewed and updated. Social History   Tobacco Use  Smoking Status Light Tobacco Smoker  . Packs/day: 1.00  . Types: Cigarettes  . Start date: 07/21/1976  Smokeless Tobacco Never Used  Tobacco Comment   'almost done with smoking'   ROS: Per HPI   Objective:    BP 119/68   Pulse 80   Temp 97.9 F (36.6 C) (Oral)   Ht 5\' 9"  (1.753 m)   Wt 168 lb 2 oz (76.3 kg)   BMI 24.83 kg/m   Wt Readings from Last 3 Encounters:  11/27/18 168 lb 2 oz (76.3 kg)  07/11/18 166 lb (75.3 kg)  08/08/17 172 lb (78 kg)    Gen: NAD, alert, cooperative with exam, NCAT EYES: EOMI, no conjunctival injection, or no icterus CV: NRRR, normal S1/S2, no murmur, distal pulses 2+ b/l Resp: CTABL, no wheezes, normal WOB Ext: No edema, warm Neuro: Alert and oriented MSK: no effusion bilateral knees, no joint line tenderness.  Assessment & Plan:  Braxlee was seen today for arthritis.  Diagnoses and all orders for this visit:  Arthritis of left knee Continue below.  Will check creatinine, none recent in system. -     meloxicam (MOBIC) 15 MG tablet; Take 1 tablet (15 mg total) by mouth daily.  Encounter for medication monitoring -     Basic Metabolic Panel   Follow up plan: Return in about 6 months (around 05/28/2019). Rex Kras, MD [facility]

## 2018-11-28 LAB — BASIC METABOLIC PANEL
BUN / CREAT RATIO: 11 (ref 9–20)
BUN: 11 mg/dL (ref 6–24)
CO2: 24 mmol/L (ref 20–29)
CREATININE: 0.98 mg/dL (ref 0.76–1.27)
Calcium: 9.2 mg/dL (ref 8.7–10.2)
Chloride: 103 mmol/L (ref 96–106)
GFR calc Af Amer: 98 mL/min/{1.73_m2} (ref >59)
GFR calc non Af Amer: 85 mL/min/{1.73_m2} (ref >59)
GLUCOSE: 90 mg/dL (ref 65–99)
Potassium: 4.3 mmol/L (ref 3.5–5.2)
SODIUM: 143 mmol/L (ref 134–144)

## 2024-09-15 DIAGNOSIS — R351 Nocturia: Secondary | ICD-10-CM | POA: Insufficient documentation

## 2024-09-15 DIAGNOSIS — Z0001 Encounter for general adult medical examination with abnormal findings: Secondary | ICD-10-CM | POA: Insufficient documentation

## 2024-09-15 NOTE — Progress Notes (Addendum)
 Subjective:  Patient ID: Robert Thomas, male    DOB: [birth year], 63 y.o.   MRN: [id number]  Patient Care Team: Zollie Lowers, MD as PCP - General (Family Medicine)   Chief Complaint:  No chief complaint on file.   HPI: Robert Thomas is a 63 y.o. male presenting on 09/16/2024 for No chief complaint on file.   Discussed the use of AI scribe software for clinical note transcription with the patient, who gave verbal consent to proceed.  History of Present Illness       Relevant past medical, surgical, family, and social history reviewed and updated as indicated.  Allergies and medications reviewed and updated. Data reviewed: Chart in Epic.   Past Medical History:  Diagnosis Date   Arrhythmia    Heart palpitations    Vitamin D  deficiency     No past surgical history on file.  Social History   Socioeconomic History   Marital status: Married    Spouse name: Not on file   Number of children: Not on file   Years of education: Not on file   Highest education level: Not on file  Occupational History   Not on file  Tobacco Use   Smoking status: Light Smoker    Current packs/day: 1.00    Average packs/day: 1 pack/day for 48.2 years (48.2 ttl pk-yrs)    Types: Cigarettes    Start date: 07/21/1976   Smokeless tobacco: Never   Tobacco comments:    'almost done with smoking'  Vaping Use   Vaping status: Never Used  Substance and Sexual Activity   Alcohol use: No   Drug use: No   Sexual activity: Not on file  Other Topics Concern   Not on file  Social History Narrative   Not on file   Social Drivers of Health   Financial Resource Strain: Not on file  Food Insecurity: Not on file  Transportation Needs: Not on file  Physical Activity: Not on file  Stress: Not on file  Social Connections: Not on file  Intimate Partner Violence: Not on file    Outpatient Encounter Medications as of 09/16/2024  Medication Sig   meloxicam  (MOBIC ) 15 MG tablet Take 1 tablet  (15 mg total) by mouth daily.   No facility-administered encounter medications on file as of 09/16/2024.    No Known Allergies  Pertinent ROS per HPI, otherwise unremarkable      Objective:  There were no vitals taken for this visit.   Wt Readings from Last 3 Encounters:  11/27/18 168 lb 2 oz (76.3 kg)  07/11/18 166 lb (75.3 kg)  08/08/17 172 lb (78 kg)    Physical Exam Physical Exam      Results for orders placed or performed in visit on 11/27/18  Basic Metabolic Panel   Collection Time: 11/27/18  2:28 PM  Result Value Ref Range   Glucose 90 65 - 99 mg/dL   BUN 11 6 - 24 mg/dL   Creatinine, Ser 9.01 0.76 - 1.27 mg/dL   GFR calc non Af Amer 85 >59 mL/min/1.73   GFR calc Af Amer 98 >59 mL/min/1.73   BUN/Creatinine Ratio 11 9 - 20   Sodium 143 134 - 144 mmol/L   Potassium 4.3 3.5 - 5.2 mmol/L   Chloride 103 96 - 106 mmol/L   CO2 24 20 - 29 mmol/L   Calcium 9.2 8.7 - 10.2 mg/dL       Pertinent labs & imaging results that  were available during my care of the patient were reviewed by me and considered in my medical decision making.  Assessment & Plan:  There are no diagnoses linked to this encounter.   Assessment and Plan Assessment & Plan       Continue all other maintenance medications.  Follow up plan: No follow-ups on file.   Continue healthy lifestyle choices, including diet (rich in fruits, vegetables, and lean proteins, and low in salt and simple carbohydrates) and exercise (at least 30 minutes of moderate physical activity daily).  Educational handout given for ***  The above assessment and management plan was discussed with the patient. The patient verbalized understanding of and has agreed to the management plan. Patient is aware to call the clinic if they develop any new symptoms or if symptoms persist or worsen. Patient is aware when to return to the clinic for a follow-up visit. Patient educated on when it is appropriate to go to the emergency  department.  @SIGNATURE @

## 2024-09-16 ENCOUNTER — Ambulatory Visit: Payer: Self-pay | Admitting: Nurse Practitioner

## 2024-09-16 ENCOUNTER — Encounter: Payer: Self-pay | Admitting: Nurse Practitioner

## 2024-09-16 VITALS — BP 122/74 | HR 88 | Temp 97.9°F | Ht 69.0 in | Wt 166.0 lb

## 2024-09-16 DIAGNOSIS — F17200 Nicotine dependence, unspecified, uncomplicated: Secondary | ICD-10-CM

## 2024-09-16 DIAGNOSIS — F17210 Nicotine dependence, cigarettes, uncomplicated: Secondary | ICD-10-CM

## 2024-09-16 DIAGNOSIS — E559 Vitamin D deficiency, unspecified: Secondary | ICD-10-CM

## 2024-09-16 DIAGNOSIS — Z1211 Encounter for screening for malignant neoplasm of colon: Secondary | ICD-10-CM

## 2024-09-16 DIAGNOSIS — E782 Mixed hyperlipidemia: Secondary | ICD-10-CM

## 2024-09-16 DIAGNOSIS — R351 Nocturia: Secondary | ICD-10-CM | POA: Diagnosis not present

## 2024-09-16 DIAGNOSIS — Z0001 Encounter for general adult medical examination with abnormal findings: Secondary | ICD-10-CM

## 2024-09-16 LAB — LIPID PANEL

## 2024-09-16 MED ORDER — TAMSULOSIN HCL 0.4 MG PO CAPS: 0.4000 mg | ORAL_CAPSULE | Freq: Every day | ORAL | 0 refills | Status: DC

## 2024-09-17 ENCOUNTER — Ambulatory Visit: Payer: Self-pay | Admitting: Nurse Practitioner

## 2024-09-17 ENCOUNTER — Other Ambulatory Visit: Payer: Self-pay | Admitting: Nurse Practitioner

## 2024-09-17 DIAGNOSIS — Z122 Encounter for screening for malignant neoplasm of respiratory organs: Secondary | ICD-10-CM

## 2024-09-17 DIAGNOSIS — F17210 Nicotine dependence, cigarettes, uncomplicated: Secondary | ICD-10-CM

## 2024-09-17 DIAGNOSIS — Z87891 Personal history of nicotine dependence: Secondary | ICD-10-CM

## 2024-09-17 DIAGNOSIS — E559 Vitamin D deficiency, unspecified: Secondary | ICD-10-CM

## 2024-09-17 DIAGNOSIS — E782 Mixed hyperlipidemia: Secondary | ICD-10-CM

## 2024-09-17 LAB — CMP14+EGFR
ALT: 13 IU/L (ref 0–44)
AST: 11 IU/L (ref 0–40)
Albumin: 4.4 g/dL (ref 3.9–4.9)
Alkaline Phosphatase: 111 IU/L (ref 47–123)
BUN/Creatinine Ratio: 9 — AB (ref 10–24)
BUN: 12 mg/dL (ref 8–27)
Bilirubin Total: 0.4 mg/dL (ref 0.0–1.2)
CO2: 23 mmol/L (ref 20–29)
Calcium: 9.6 mg/dL (ref 8.6–10.2)
Chloride: 104 mmol/L (ref 96–106)
Creatinine, Ser: 1.37 mg/dL — AB (ref 0.76–1.27)
Globulin, Total: 2.4 g/dL (ref 1.5–4.5)
Glucose: 95 mg/dL (ref 70–99)
Potassium: 4.9 mmol/L (ref 3.5–5.2)
Sodium: 141 mmol/L (ref 134–144)
Total Protein: 6.8 g/dL (ref 6.0–8.5)
eGFR: 58 mL/min/1.73 — AB (ref >59)

## 2024-09-17 LAB — LIPID PANEL
Cholesterol, Total: 168 mg/dL (ref 100–199)
HDL: 35 mg/dL — AB (ref >39)
LDL CALC COMMENT:: 4.8 ratio (ref 0.0–5.0)
LDL Chol Calc (NIH): 81 mg/dL (ref 0–99)
Triglycerides: 322 mg/dL — AB (ref 0–149)
VLDL Cholesterol Cal: 52 mg/dL — AB (ref 5–40)

## 2024-09-17 LAB — CBC WITH DIFFERENTIAL/PLATELET
Basophils Absolute: 0.1 x10E3/uL (ref 0.0–0.2)
Basos: 1 %
EOS (ABSOLUTE): 0.1 x10E3/uL (ref 0.0–0.4)
Eos: 1 %
Hematocrit: 51.8 % — ABNORMAL HIGH (ref 37.5–51.0)
Hemoglobin: 17.3 g/dL (ref 13.0–17.7)
Immature Grans (Abs): 0 x10E3/uL (ref 0.0–0.1)
Immature Granulocytes: 0 %
Lymphocytes Absolute: 2.9 x10E3/uL (ref 0.7–3.1)
Lymphs: 37 %
MCH: 30 pg (ref 26.6–33.0)
MCHC: 33.4 g/dL (ref 31.5–35.7)
MCV: 90 fL (ref 79–97)
Monocytes Absolute: 0.6 x10E3/uL (ref 0.1–0.9)
Monocytes: 8 %
Neutrophils Absolute: 4.2 x10E3/uL (ref 1.4–7.0)
Neutrophils: 53 %
Platelets: 177 x10E3/uL (ref 150–450)
RBC: 5.77 x10E6/uL (ref 4.14–5.80)
RDW: 13 % (ref 11.6–15.4)
WBC: 7.9 x10E3/uL (ref 3.4–10.8)

## 2024-09-17 LAB — THYROID PANEL WITH TSH
Free Thyroxine Index: 1.7 (ref 1.2–4.9)
T3 Uptake Ratio: 26 % (ref 24–39)
T4, Total: 6.6 ug/dL (ref 4.5–12.0)
TSH: 2.91 u[IU]/mL (ref 0.450–4.500)

## 2024-09-17 LAB — PSA, TOTAL AND FREE
PSA, Free Pct: 23.5 %
PSA, Free: 0.4 ng/mL
Prostate Specific Ag, Serum: 1.7 ng/mL (ref 0.0–4.0)

## 2024-09-17 LAB — VITAMIN D 25 HYDROXY (VIT D DEFICIENCY, FRACTURES): Vit D, 25-Hydroxy: 17.7 ng/mL — AB (ref 30.0–100.0)

## 2024-09-17 MED ORDER — VITAMIN D (ERGOCALCIFEROL) 1.25 MG (50000 UNIT) PO CAPS: 50000.0000 [IU] | ORAL_CAPSULE | ORAL | 1 refills | Status: AC

## 2024-09-17 MED ORDER — PRAVASTATIN SODIUM 10 MG PO TABS: 10.0000 mg | ORAL_TABLET | Freq: Every day | ORAL | 1 refills | Status: AC

## 2024-09-24 NOTE — Telephone Encounter (Signed)
 Lung Cancer Screening Narrative/Criteria Questionnaire (Cigarette Smokers Only- No Cigars/Pipes/vapes)   Robert Thomas   SDMV:10/12/2024 at 10:30 am [name]        Jun 26, 1961               LDCT: Will schedule during SDMV or call back sooner with available date.     63 y.o.   Phone: [phone]  Lung Screening Narrative (confirm age 77-77 yrs Medicare / 50-80 yrs Private pay insurance)   Insurance information:[employer]   Referring Provider:St. Morton Hummer, MD   This screening involves an initial phone call with a team member from our program. It is called a shared decision making visit. The initial meeting is required by  insurance and Medicare to make sure you understand the program. This appointment takes about 15-20 minutes to complete. You will complete the screening scan at your scheduled date/time.  This scan takes about 5-10 minutes to complete. You can eat and drink normally before and after the scan.  Criteria questions for Lung Cancer Screening:   Are you a current or former smoker? Current Age began smoking: 14   If you are a former smoker, what year did you quit smoking? Never quit. (within 15 yrs)   To calculate your smoking history, I need an accurate estimate of how many packs of cigarettes you smoked per day and for how many years. (Not just the number of PPD you are now smoking)   Years smoking 49 x Packs per day 1 = Pack years 49   (at least 20 pack yrs)   (Make sure they understand that we need to know how much they have smoked in the past, not just the number of PPD they are smoking now)  Do you have a personal history of cancer?  No    Do you have a family history of cancer? No  Are you coughing up blood?  No  Have you had unexplained weight loss of 15 lbs or more in the last 6 months? No  It looks like you meet all criteria.  When would be a good time for us  to schedule you for this screening?   Additional information: N/A

## 2024-09-28 ENCOUNTER — Other Ambulatory Visit: Payer: Self-pay | Admitting: Emergency Medicine

## 2024-09-28 DIAGNOSIS — Z87891 Personal history of nicotine dependence: Secondary | ICD-10-CM

## 2024-09-28 DIAGNOSIS — F17210 Nicotine dependence, cigarettes, uncomplicated: Secondary | ICD-10-CM

## 2024-09-28 DIAGNOSIS — Z122 Encounter for screening for malignant neoplasm of respiratory organs: Secondary | ICD-10-CM

## 2024-10-12 ENCOUNTER — Encounter: Payer: Self-pay | Admitting: *Deleted

## 2024-10-12 ENCOUNTER — Ambulatory Visit: Admitting: *Deleted

## 2024-10-12 DIAGNOSIS — F17210 Nicotine dependence, cigarettes, uncomplicated: Secondary | ICD-10-CM | POA: Diagnosis not present

## 2024-10-12 NOTE — Patient Instructions (Signed)

## 2024-10-12 NOTE — Progress Notes (Signed)
 Virtual Visit via Telephone Note  I connected with Robert Thomas on 10/12/24 at 10:30 AM EST by telephone and verified that I am speaking with the correct person using two identifiers.  Location: Patient: at home Provider: [address], [place], [place], [address]    I discussed the limitations, risks, security and privacy concerns of performing an evaluation and management service by telephone and the availability of in person appointments. I also discussed with the patient that there may be a patient responsible charge related to this service. The patient expressed understanding and agreed to proceed.   Shared Decision Making Visit Lung Cancer Screening Program [id number])   Eligibility: Age 63 y.o. Pack Years Smoking History Calculation 49 (# packs/per year x # years smoked) Recent History of coughing up blood  no Unexplained weight loss? no ( >Than 15 pounds within the last 6 months ) Prior History Lung / other cancer no (Diagnosis within the last 5 years already requiring surveillance chest CT Scans). Smoking Status Current Smoker Former Smokers: Years since quit: n/a  Quit Date: n/a  Visit Components: Discussion included one or more decision making aids. yes Discussion included risk/benefits of screening. yes Discussion included potential follow up diagnostic testing for abnormal scans. yes Discussion included meaning and risk of over diagnosis. yes Discussion included meaning and risk of False Positives. yes Discussion included meaning of total radiation exposure. yes  Counseling Included: Importance of adherence to annual lung cancer LDCT screening. yes Impact of comorbidities on ability to participate in the program. yes Ability and willingness to under diagnostic treatment. yes  Smoking Cessation Counseling: Current Smokers:  Discussed importance of smoking cessation. yes Information about tobacco cessation classes and interventions provided to patient.  yes Patient provided with ticket for LDCT Scan. no Symptomatic Patient. no  Counseling(Intermediate counseling: > three minutes) [address]406 Diagnosis Code: Tobacco Use Z72.0 Asymptomatic Patient yes  Smoking/Tobacco Cessation Counseling Robert Thomas is a current user of tobacco or nicotine products. He is ready to quit at this time. Counseling provided today addressed the risks of continued use and the benefits of cessation. Discussed tobacco/nicotine use history, readiness to quit, and evidence-based treatment options including behavioral strategies, support resources, and pharmacologic therapies. Provided encouragement and educational materials on steps and resources to quit smoking. Patient questions were addressed, and follow-up recommended for continued support. Total time spent on counseling: 5 minutes.   Former Smokers:  Discussed the importance of maintaining cigarette abstinence. yes Diagnosis Code: Personal History of Nicotine Dependence. S12.108 Information about tobacco cessation classes and interventions provided to patient. Yes Patient provided with ticket for LDCT Scan. no Written Order for Lung Cancer Screening with LDCT placed in Epic. Yes (CT Chest Lung Cancer Screening Low Dose W/O CM) [id number] Z12.2-Screening of respiratory organs Z87.891-Personal history of nicotine dependence   Laneta Speaks, RN

## 2024-10-13 ENCOUNTER — Ambulatory Visit (HOSPITAL_COMMUNITY)
Admission: RE | Admit: 2024-10-13 | Discharge: 2024-10-13 | Disposition: A | Source: Ambulatory Visit | Attending: Acute Care | Admitting: Acute Care

## 2024-10-13 DIAGNOSIS — Z87891 Personal history of nicotine dependence: Secondary | ICD-10-CM | POA: Insufficient documentation

## 2024-10-13 DIAGNOSIS — Z122 Encounter for screening for malignant neoplasm of respiratory organs: Secondary | ICD-10-CM | POA: Insufficient documentation

## 2024-10-13 DIAGNOSIS — F17210 Nicotine dependence, cigarettes, uncomplicated: Secondary | ICD-10-CM | POA: Insufficient documentation

## 2024-10-14 ENCOUNTER — Encounter (INDEPENDENT_AMBULATORY_CARE_PROVIDER_SITE_OTHER): Payer: Self-pay | Admitting: *Deleted

## 2024-10-16 ENCOUNTER — Other Ambulatory Visit: Payer: Self-pay

## 2024-10-16 DIAGNOSIS — Z122 Encounter for screening for malignant neoplasm of respiratory organs: Secondary | ICD-10-CM

## 2024-10-16 DIAGNOSIS — Z87891 Personal history of nicotine dependence: Secondary | ICD-10-CM

## 2024-10-16 DIAGNOSIS — F17210 Nicotine dependence, cigarettes, uncomplicated: Secondary | ICD-10-CM

## 2024-12-11 ENCOUNTER — Other Ambulatory Visit: Payer: Self-pay | Admitting: Nurse Practitioner

## 2024-12-11 DIAGNOSIS — R351 Nocturia: Secondary | ICD-10-CM

## 2024-12-11 DIAGNOSIS — Z0001 Encounter for general adult medical examination with abnormal findings: Secondary | ICD-10-CM
